# Patient Record
Sex: Female | Born: 1955 | Hispanic: No | Marital: Married | Smoking: Never smoker
Health system: Southern US, Community
[De-identification: ages and names within clinical notes are randomized; demographics above are authoritative.]

## PROBLEM LIST (undated history)

## (undated) ENCOUNTER — Emergency Department (HOSPITAL_BASED_OUTPATIENT_CLINIC_OR_DEPARTMENT_OTHER): Admission: EM | Payer: Self-pay

## (undated) DIAGNOSIS — I10 Essential (primary) hypertension: Secondary | ICD-10-CM

## (undated) DIAGNOSIS — K219 Gastro-esophageal reflux disease without esophagitis: Secondary | ICD-10-CM

## (undated) DIAGNOSIS — E785 Hyperlipidemia, unspecified: Secondary | ICD-10-CM

## (undated) DIAGNOSIS — E7800 Pure hypercholesterolemia, unspecified: Secondary | ICD-10-CM

## (undated) HISTORY — PX: ABDOMINAL HYSTERECTOMY: SHX81

## (undated) HISTORY — PX: APPENDECTOMY: SHX54

---

## 2000-11-18 ENCOUNTER — Encounter: Admission: RE | Admit: 2000-11-18 | Discharge: 2000-11-18 | Payer: Self-pay | Admitting: *Deleted

## 2000-11-18 ENCOUNTER — Encounter: Payer: Self-pay | Admitting: *Deleted

## 2016-03-12 ENCOUNTER — Emergency Department (HOSPITAL_BASED_OUTPATIENT_CLINIC_OR_DEPARTMENT_OTHER): Payer: Worker's Compensation

## 2016-03-12 ENCOUNTER — Emergency Department (HOSPITAL_BASED_OUTPATIENT_CLINIC_OR_DEPARTMENT_OTHER)
Admission: EM | Admit: 2016-03-12 | Discharge: 2016-03-12 | Disposition: A | Discharge status: Home / Self Care | Payer: Worker's Compensation | Attending: Emergency Medicine | Admitting: Emergency Medicine

## 2016-03-12 ENCOUNTER — Encounter (HOSPITAL_BASED_OUTPATIENT_CLINIC_OR_DEPARTMENT_OTHER): Payer: Self-pay | Admitting: *Deleted

## 2016-03-12 DIAGNOSIS — Y9301 Activity, walking, marching and hiking: Secondary | ICD-10-CM | POA: Insufficient documentation

## 2016-03-12 DIAGNOSIS — S8991XA Unspecified injury of right lower leg, initial encounter: Secondary | ICD-10-CM | POA: Diagnosis present

## 2016-03-12 DIAGNOSIS — Y999 Unspecified external cause status: Secondary | ICD-10-CM | POA: Diagnosis not present

## 2016-03-12 DIAGNOSIS — S80211A Abrasion, right knee, initial encounter: Secondary | ICD-10-CM | POA: Insufficient documentation

## 2016-03-12 DIAGNOSIS — Z79899 Other long term (current) drug therapy: Secondary | ICD-10-CM | POA: Diagnosis not present

## 2016-03-12 DIAGNOSIS — M25511 Pain in right shoulder: Secondary | ICD-10-CM | POA: Insufficient documentation

## 2016-03-12 DIAGNOSIS — W010XXA Fall on same level from slipping, tripping and stumbling without subsequent striking against object, initial encounter: Secondary | ICD-10-CM | POA: Diagnosis not present

## 2016-03-12 DIAGNOSIS — W19XXXA Unspecified fall, initial encounter: Secondary | ICD-10-CM

## 2016-03-12 DIAGNOSIS — I10 Essential (primary) hypertension: Secondary | ICD-10-CM | POA: Insufficient documentation

## 2016-03-12 DIAGNOSIS — M25561 Pain in right knee: Secondary | ICD-10-CM

## 2016-03-12 DIAGNOSIS — Y929 Unspecified place or not applicable: Secondary | ICD-10-CM | POA: Insufficient documentation

## 2016-03-12 HISTORY — DX: Gastro-esophageal reflux disease without esophagitis: K21.9

## 2016-03-12 HISTORY — DX: Essential (primary) hypertension: I10

## 2016-03-12 HISTORY — DX: Hyperlipidemia, unspecified: E78.5

## 2016-03-12 NOTE — ED Triage Notes (Signed)
pt c/o fall at work with right knee and right shoulder pain

## 2016-03-12 NOTE — Discharge Instructions (Signed)
Your xrays are normal today.  Please wear the immobilizer while up and about.  Take 800 mg ibuprofen and 1000 mg Tylenol for pain.  Ice your knee 10-20 min three times daily.  Follow up with your primary care physician.  Return to the ED for any new or concerning symptoms.

## 2016-03-12 NOTE — ED Notes (Signed)
[facility] drug and etoh screen done

## 2016-03-12 NOTE — ED Notes (Signed)
ED Provider at bedside. 

## 2016-03-12 NOTE — ED Provider Notes (Signed)
[facility]   CSN: [id number] Arrival date & time: 03/12/16  1136     History   Chief Complaint Chief Complaint  Patient presents with  . Knee Injury    HPI Rachael Lawrence is a 61 y.o. female.  HPI Rachael Lawrence is a 61 y.o. female with PMH significant for GERD, HLD, and HTN who presents with right knee pain and right shoulder pain s/p fall.  Patient was walking to work when she slipped on ice, landing on her right knee.  No head injury or LOC.  Denies pain elsewhere.  Associated symptoms include abrasion to right knee and swelling.  She is ambulatory.  Denies numbness or weakness. Tetanus is up to date.   Past Medical History:  Diagnosis Date  . GERD (gastroesophageal reflux disease)   . Hyperlipemia   . Hypertension     There are no active problems to display for this patient.   Past Surgical History:  Procedure Laterality Date  . ABDOMINAL HYSTERECTOMY    . APPENDECTOMY      OB History    No data available       Home Medications    Prior to Admission medications   Medication Sig Start Date End Date Taking? Authorizing Provider  nadolol (CORGARD) 80 MG tablet Take 80 mg by mouth daily.   Yes Historical Provider, MD  pantoprazole (PROTONIX) 40 MG tablet Take 40 mg by mouth daily.   Yes Historical Provider, MD  simvastatin (ZOCOR) 20 MG tablet Take 20 mg by mouth daily.   Yes Historical Provider, MD    Family History History reviewed. No pertinent family history.  Social History Social History  Substance Use Topics  . Smoking status: Never Smoker  . Smokeless tobacco: Never Used  . Alcohol use No     Allergies   Patient has no known allergies.   Review of Systems Review of Systems All other systems negative unless otherwise stated in HPI   Physical Exam Updated Vital Signs BP 138/80 (BP Location: Right Arm)   Pulse 67   Temp 98.2 F (36.8 C)   Resp 18   Ht 5\' 6"  (1.676 m)   Wt 96.6 kg   SpO2 99%   BMI 34.38 kg/m     Physical Exam  Constitutional: She is oriented to person, place, and time. She appears well-developed and well-nourished.  HENT:  Head: Normocephalic and atraumatic.  Right Ear: External ear normal.  Left Ear: External ear normal.  Eyes: Conjunctivae are normal. No scleral icterus.  Neck: No tracheal deviation present.  Cardiovascular:  Pulses:      Dorsalis pedis pulses are 2+ on the right side, and 2+ on the left side.  Pulmonary/Chest: Effort normal. No respiratory distress.  Abdominal: She exhibits no distension.  Musculoskeletal: Normal range of motion. She exhibits tenderness.  Right knee with small abrasion and swelling. Normal ROM.  Neurological: She is alert and oriented to person, place, and time.  Normal strength and sensation throughout lower extremities.   Skin: Skin is warm and dry.  Psychiatric: She has a normal mood and affect. Her behavior is normal.     ED Treatments / Results  Labs (all labs ordered are listed, but only abnormal results are displayed) Labs Reviewed - No data to display  EKG  EKG Interpretation None       Radiology Dg Shoulder Right  Result Date: 03/12/2016 CLINICAL DATA:  Fall. EXAM: RIGHT SHOULDER - 2+ VIEW COMPARISON:  No recent.  FINDINGS: Metallic densities noted over the right shoulder soft tissues most likely within the patient's hair. Acromioclavicular and glenohumeral degenerative change. No evidence of fracture or dislocation. No acute abnormality. IMPRESSION: Acromioclavicular and glenohumeral degenerative change. No acute bony abnormality identified. Electronically Signed   By: Maisie Fushomas  Register   On: 03/12/2016 12:10   Dg Knee Complete 4 Views Right  Result Date: 03/12/2016 CLINICAL DATA:  Shoulder pain.  Fall. EXAM: RIGHT KNEE - COMPLETE 4+ VIEW COMPARISON:  No recent prior . FINDINGS: No acute bony or joint abnormality identified. No evidence of fracture or dislocation. Medial and patellofemoral compartment degenerative  change. Small knee joint effusion cannot be excluded IMPRESSION: 1. Small knee joint effusion.  No acute bony abnormality identified. 2.  Medial and patellofemoral compartment degenerative change . Electronically Signed   By: Maisie Fushomas  Register   On: 03/12/2016 12:12    Procedures Procedures (including critical care time)  Medications Ordered in ED Medications - No data to display   Initial Impression / Assessment and Plan / ED Course  I have reviewed the triage vital signs and the nursing notes.  Pertinent labs & imaging results that were available during my care of the patient were reviewed by me and considered in my medical decision making (see chart for details).  Clinical Course    Patient here s/p mechanical fall now with right knee pain. Patient X-Ray negative for obvious fracture or dislocation.  Pt advised to follow up with orthopedics. Patient given knee immobilizer while in ED, conservative therapy recommended and discussed. Patient will be discharged home & is agreeable with above plan. Returns precautions discussed. Pt appears safe for discharge.   Final Clinical Impressions(s) / ED Diagnoses   Final diagnoses:  Acute pain of right knee  Fall, initial encounter    New Prescriptions New Prescriptions   No medications on file     Cheri FowlerKayla Ishmel Acevedo, PA-C 03/12/16 1358    Nira ConnPedro Eduardo Cardama, MD 03/13/16 1142

## 2016-04-08 ENCOUNTER — Emergency Department (HOSPITAL_BASED_OUTPATIENT_CLINIC_OR_DEPARTMENT_OTHER)
Admission: EM | Admit: 2016-04-08 | Discharge: 2016-04-09 | Disposition: A | Discharge status: Home / Self Care | Payer: 59 | Attending: Emergency Medicine | Admitting: Emergency Medicine

## 2016-04-08 ENCOUNTER — Encounter (HOSPITAL_BASED_OUTPATIENT_CLINIC_OR_DEPARTMENT_OTHER): Payer: Self-pay

## 2016-04-08 ENCOUNTER — Emergency Department (HOSPITAL_BASED_OUTPATIENT_CLINIC_OR_DEPARTMENT_OTHER): Payer: 59

## 2016-04-08 DIAGNOSIS — Z79899 Other long term (current) drug therapy: Secondary | ICD-10-CM | POA: Insufficient documentation

## 2016-04-08 DIAGNOSIS — R0789 Other chest pain: Secondary | ICD-10-CM | POA: Insufficient documentation

## 2016-04-08 DIAGNOSIS — I10 Essential (primary) hypertension: Secondary | ICD-10-CM | POA: Diagnosis not present

## 2016-04-08 HISTORY — DX: Pure hypercholesterolemia, unspecified: E78.00

## 2016-04-08 LAB — BASIC METABOLIC PANEL
ANION GAP: 9 (ref 5–15)
BUN: 16 mg/dL (ref 6–20)
CALCIUM: 9 mg/dL (ref 8.9–10.3)
CO2: 27 mmol/L (ref 22–32)
CREATININE: 0.62 mg/dL (ref 0.44–1.00)
Chloride: 103 mmol/L (ref 101–111)
GFR calc Af Amer: >60 mL/min (ref >60)
GLUCOSE: 94 mg/dL (ref 65–99)
Potassium: 3.2 mmol/L — ABNORMAL LOW (ref 3.5–5.1)
Sodium: 139 mmol/L (ref 135–145)

## 2016-04-08 LAB — TROPONIN I

## 2016-04-08 LAB — CBC
HCT: 38.9 % (ref 36.0–46.0)
Hemoglobin: 12.5 g/dL (ref 12.0–15.0)
MCH: 27.4 pg (ref 26.0–34.0)
MCHC: 32.1 g/dL (ref 30.0–36.0)
MCV: 85.3 fL (ref 78.0–100.0)
PLATELETS: 313 10*3/uL (ref 150–400)
RBC: 4.56 MIL/uL (ref 3.87–5.11)
RDW: 14.7 % (ref 11.5–15.5)
WBC: 8 10*3/uL (ref 4.0–10.5)

## 2016-04-08 NOTE — ED Triage Notes (Signed)
C/o pain to left chest/breast x 3 days-NAD-steady gait

## 2016-04-08 NOTE — ED Notes (Signed)
Cp since Saturday w dizziness onset today denies n/v, no sob

## 2016-04-08 NOTE — ED Notes (Signed)
ED Provider at bedside. 

## 2016-04-08 NOTE — ED Provider Notes (Signed)
[facility] Provider Note: Rachael DellJ. Lane British Moyd, MD, FACEP  CSN: [id number] MRN: [id number] ARRIVAL: 04/08/16 at 1746 ROOM: [facility]  By signing my name below, I, Rachael Lawrence, attest that this documentation has been prepared under the direction and in the presence of Rachael LibraJohn Peggyann Zwiefelhofer, MD  Electronically Signed: Clovis PuAvnee Lawrence, ED Scribe. 04/08/16. 11:06 PM.   CHIEF COMPLAINT  Chest Pain   HISTORY OF PRESENT ILLNESS  HPI Comments:  Rachael Lawrence is a 61 y.o. female who presents to the Emergency Department complaining of constant left lateral chest pain well-localized under left breast onset 3 days ago. She describes her pain as a pressure and rates it as a 6/10. Nothing makes her pain better or worse. She is also having pain in the right side of her neck with associated numbness to her right upper extremity. This numbness is not changed with movement of her neck. She takes aspirin on a daily basis and noted this has provided some pain relief. Pt denies SOB, nausea and diaphoresis.   Past Medical History:  Diagnosis Date  . High cholesterol   . Hypertension     Past Surgical History:  Procedure Laterality Date  . ABDOMINAL HYSTERECTOMY    . APPENDECTOMY      No family history on file.  Social History  Substance Use Topics  . Smoking status: Never Smoker  . Smokeless tobacco: Never Used  . Alcohol use Yes     Comment: occ    Prior to Admission medications   Medication Sig Start Date End Date Taking? Authorizing Provider  HYDRALAZINE-HCTZ PO Take by mouth.   Yes Historical Provider, MD  NADOLOL PO Take by mouth.   Yes Historical Provider, MD  PANTOPRAZOLE SODIUM PO Take by mouth.   Yes Historical Provider, MD  SIMVASTATIN PO Take by mouth.   Yes Historical Provider, MD    Allergies Patient has no known allergies.   REVIEW OF SYSTEMS  Negative except as noted here or in the History of Present Illness.   PHYSICAL EXAMINATION  Initial Vital Signs Blood pressure  123/65, pulse 60, temperature 98.6 F (37 C), temperature source Oral, resp. rate 17, height 5\' 5"  (1.651 m), weight 213 lb (96.6 kg), SpO2 99 %.  Examination General: Well-developed, well-nourished female in no acute distress; appearance consistent with age of record HENT: normocephalic; atraumatic Eyes: pupils equal, round and reactive to light; extraocular muscles intact Neck: supple Heart: regular rate and rhythm; no murmurs, rubs or gallops Lungs: clear to auscultation bilaterally Chest: Non-tender and no rash at site of pain Abdomen: soft; nondistended; nontender; no masses or hepatosplenomegaly; bowel sounds present Extremities: No deformity; full range of motion; pulses normal; no edema Neurologic: Awake, alert and oriented; motor function intact in all extremities and symmetric; no facial droop Skin: Warm and dry Psychiatric: Normal mood and affect   RESULTS  Summary of this visit's results, reviewed by myself:   EKG Interpretation  Date/Time:  Monday April 08 2016 18:01:40 EST Ventricular Rate:  69 PR Interval:  138 QRS Duration: 82 QT Interval:  406 QTC Calculation: 435 R Axis:   57 Text Interpretation:  Normal sinus rhythm Normal ECG No previous ECGs available Confirmed by Rachael Harnack  MD, Rachael Lawrence ([id number]) on 04/08/2016 10:42:09 PM      Laboratory Studies: Results for orders placed or performed during the hospital encounter of 04/08/16 (from the past 24 hour(s))  Basic metabolic panel     Status: Abnormal   Collection Time: 04/08/16  8:20 PM  Result Value Ref Range   Sodium 139 135 - 145 mmol/L   Potassium 3.2 (L) 3.5 - 5.1 mmol/L   Chloride 103 101 - 111 mmol/L   CO2 27 22 - 32 mmol/L   Glucose, Bld 94 65 - 99 mg/dL   BUN 16 6 - 20 mg/dL   Creatinine, Ser 1.61 0.44 - 1.00 mg/dL   Calcium 9.0 8.9 - 09.6 mg/dL   GFR calc non Af Amer >60 >60 mL/min   GFR calc Af Amer >60 >60 mL/min   Anion gap 9 5 - 15  CBC     Status: None   Collection Time: 04/08/16  8:20 PM    Result Value Ref Range   WBC 8.0 4.0 - 10.5 K/uL   RBC 4.56 3.87 - 5.11 MIL/uL   Hemoglobin 12.5 12.0 - 15.0 g/dL   HCT 04.5 40.9 - 81.1 %   MCV 85.3 78.0 - 100.0 fL   MCH 27.4 26.0 - 34.0 pg   MCHC 32.1 30.0 - 36.0 g/dL   RDW 91.4 78.2 - 95.6 %   Platelets 313 150 - 400 K/uL  Troponin I     Status: None   Collection Time: 04/08/16  8:20 PM  Result Value Ref Range   Troponin I <0.03 <0.03 ng/mL  Troponin I     Status: None   Collection Time: 04/08/16 11:17 PM  Result Value Ref Range   Troponin I <0.03 <0.03 ng/mL   Imaging Studies: Dg Chest 2 View  Result Date: 04/08/2016 CLINICAL DATA:  Three-day history of left chest pain and right upper extremity pain. Current history of hypertension and hypercholesterolemia. EXAM: CHEST  2 VIEW COMPARISON:  None. FINDINGS: Cardiomediastinal silhouette unremarkable. Lungs clear. Bronchovascular markings normal. Pulmonary vascularity normal. No pneumothorax. No pleural effusions. Visualized bony thorax intact. IMPRESSION: Normal examination. Electronically Signed   By: Hulan Saas M.D.   On: 04/08/2016 19:24    ED COURSE  Nursing notes and initial vitals signs, including pulse oximetry, reviewed.  Vitals:   04/08/16 2200 04/08/16 2230 04/08/16 2300 04/08/16 2330  BP: 143/83 136/62 149/97 131/57  Pulse: 67 76 66 67  Resp: 19 22 16 19   Temp:      TempSrc:      SpO2: 99% 100% 100% 100%  Weight:      Height:       12:01 AM Patient's pain is atypical for cardiac chest pain. There is no exertional component nor is there associated dyspnea, nausea or diaphoresis. She will follow-up with her primary care physician tomorrow.  PROCEDURES    ED DIAGNOSES     ICD-9-CM ICD-10-CM   1. Atypical chest pain 786.59 R07.89     I personally performed the services described in this documentation, which was scribed in my presence. The recorded information has been reviewed and is accurate.     Rachael Libra, MD 04/09/16 Rachael Lawrence

## 2016-04-09 ENCOUNTER — Encounter (HOSPITAL_BASED_OUTPATIENT_CLINIC_OR_DEPARTMENT_OTHER): Payer: Self-pay

## 2017-12-23 IMAGING — DX DG SHOULDER 2+V*R*
4 series · 4 of 4 positions shown · non-contrast
Comparison: No recent.

CLINICAL DATA: Fall.

EXAM:
RIGHT SHOULDER - 2+ VIEW

[shoulder grashey]
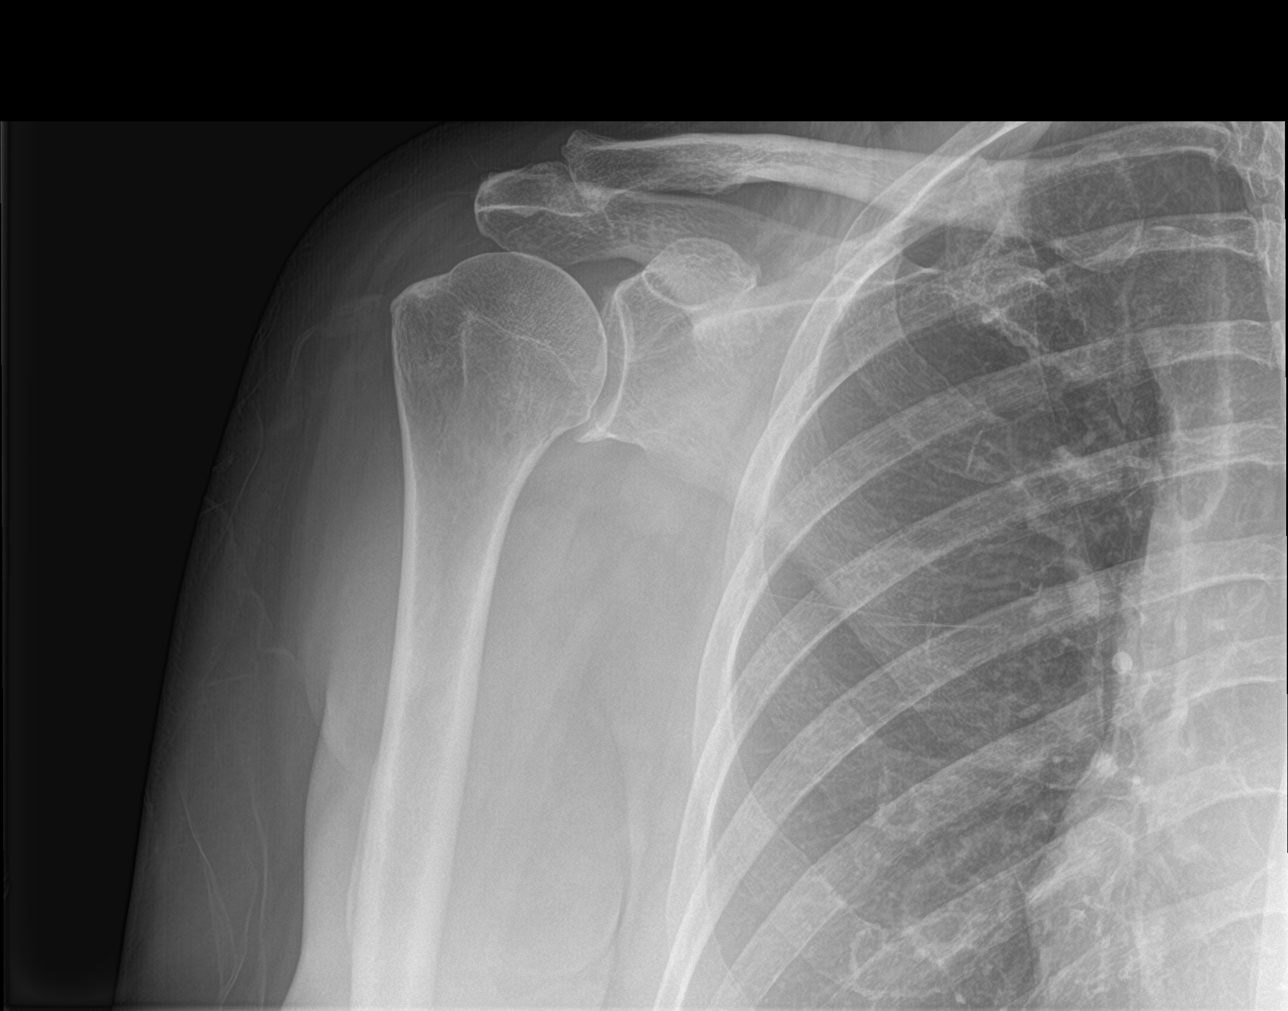

[shoulder y view (1 of 2)]
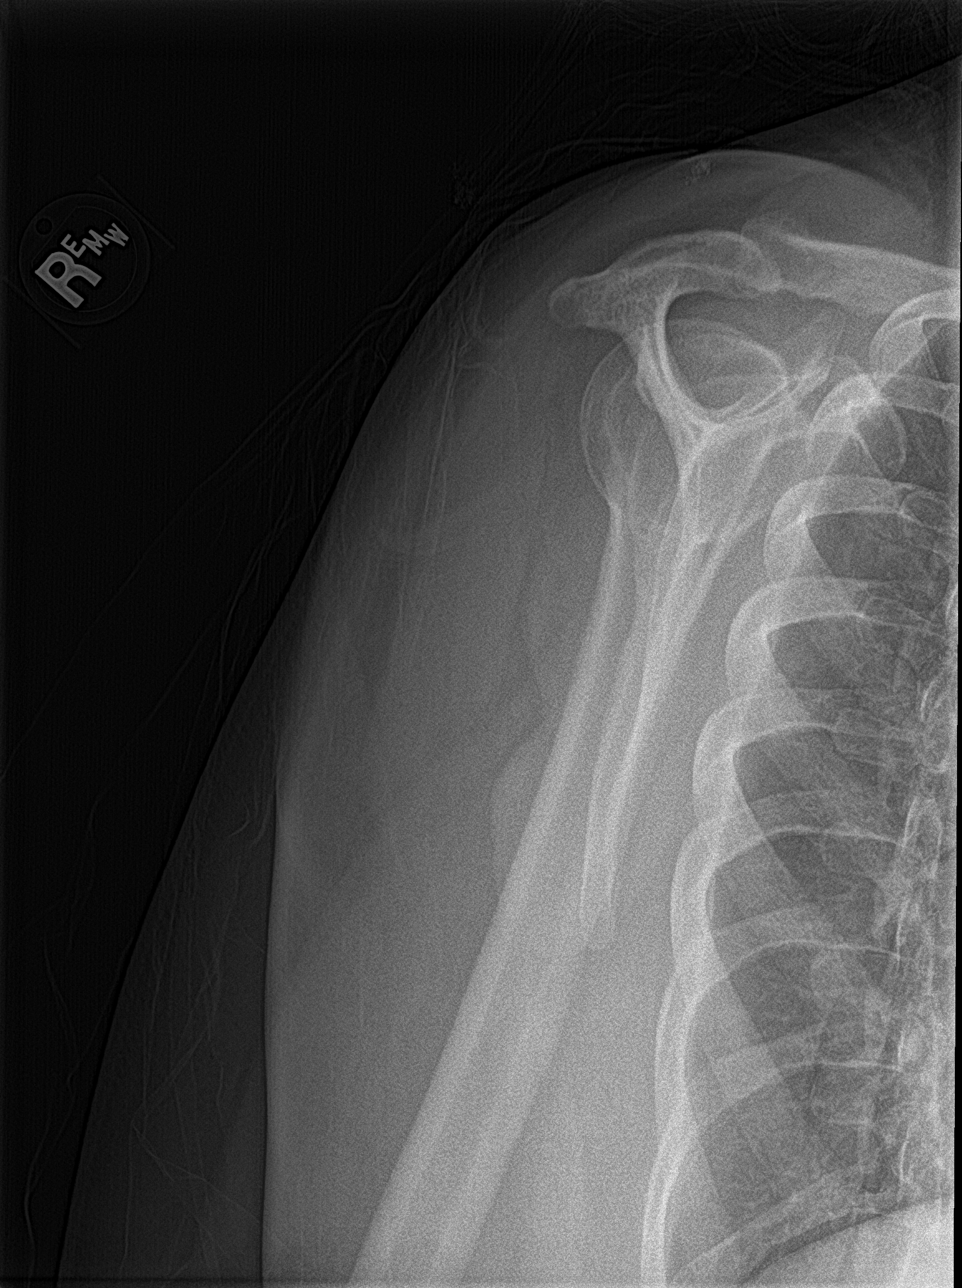

[shoulder ap neutral]
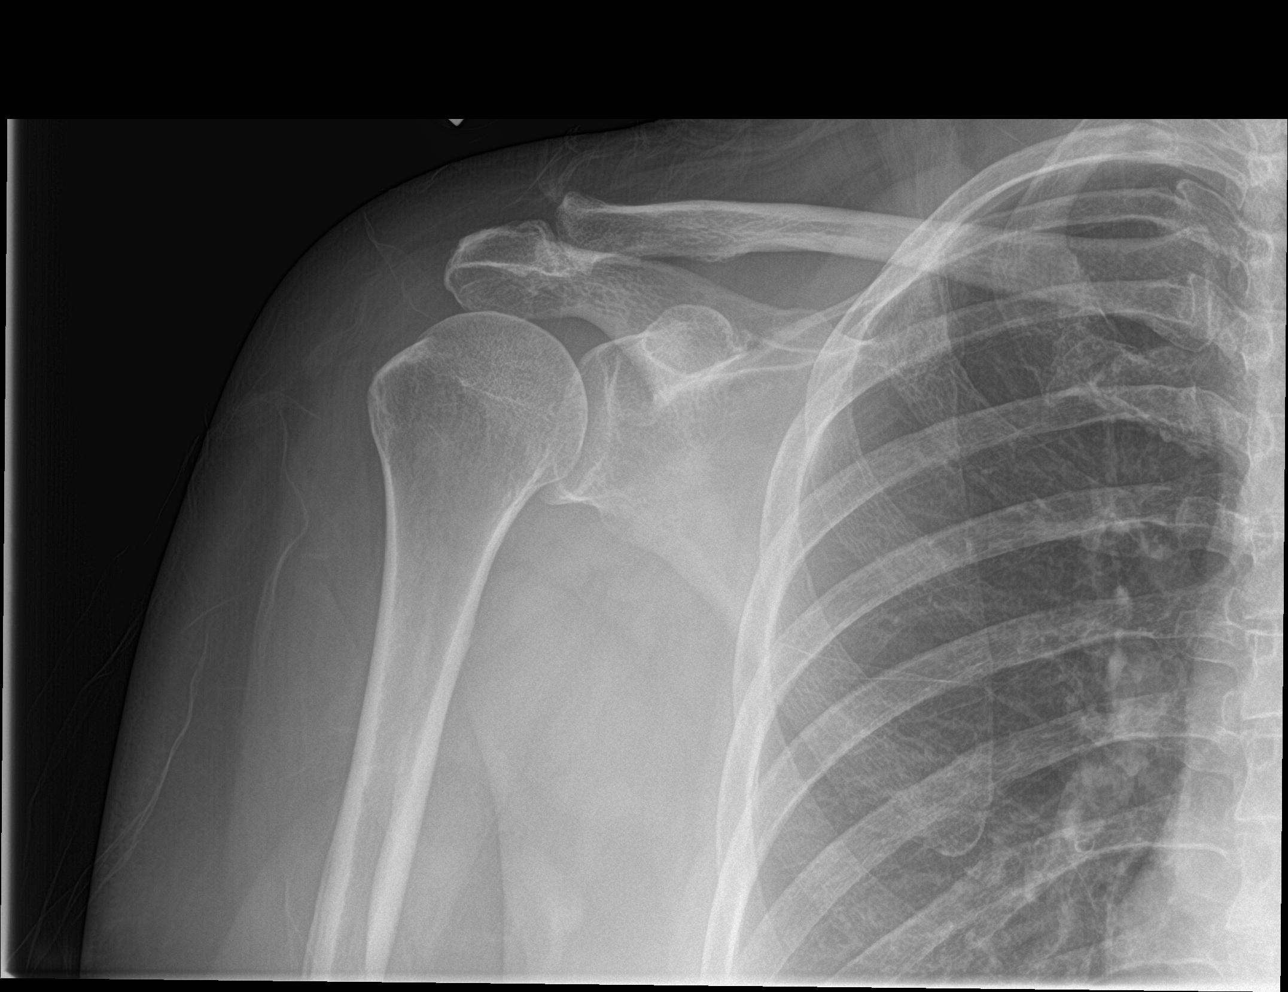

[shoulder y view (2 of 2)]
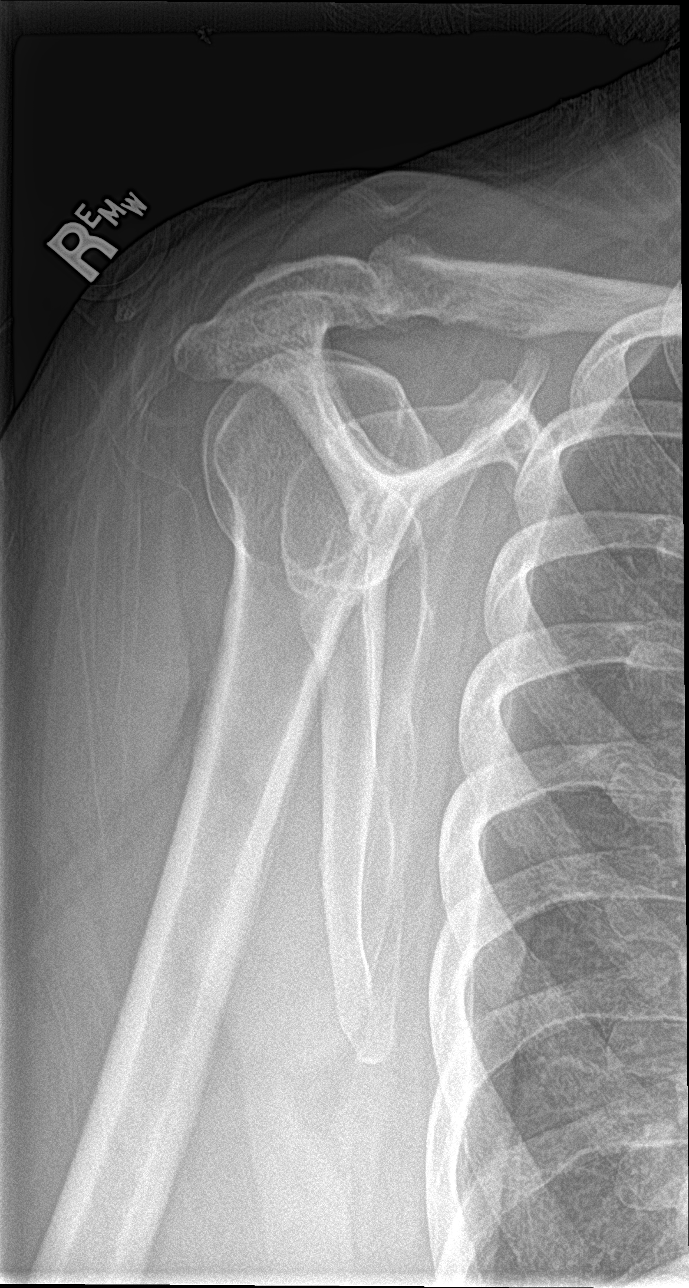

[4 of 4 positions shown; findings below may reference images not displayed]

FINDINGS: Metallic densities noted over the right shoulder soft tissues most
likely within the patient's hair. Acromioclavicular and glenohumeral
degenerative change. No evidence of fracture or dislocation. No
acute abnormality.
IMPRESSION: Acromioclavicular and glenohumeral degenerative change. No acute
bony abnormality identified.

## 2018-01-19 IMAGING — CR DG CHEST 2V
2 series · 2 of 2 positions shown · non-contrast
Comparison: None.

CLINICAL DATA: Three-day history of left chest pain and right upper
extremity pain. Current history of hypertension and
hypercholesterolemia.

EXAM:
CHEST  2 VIEW

[w chest pa]
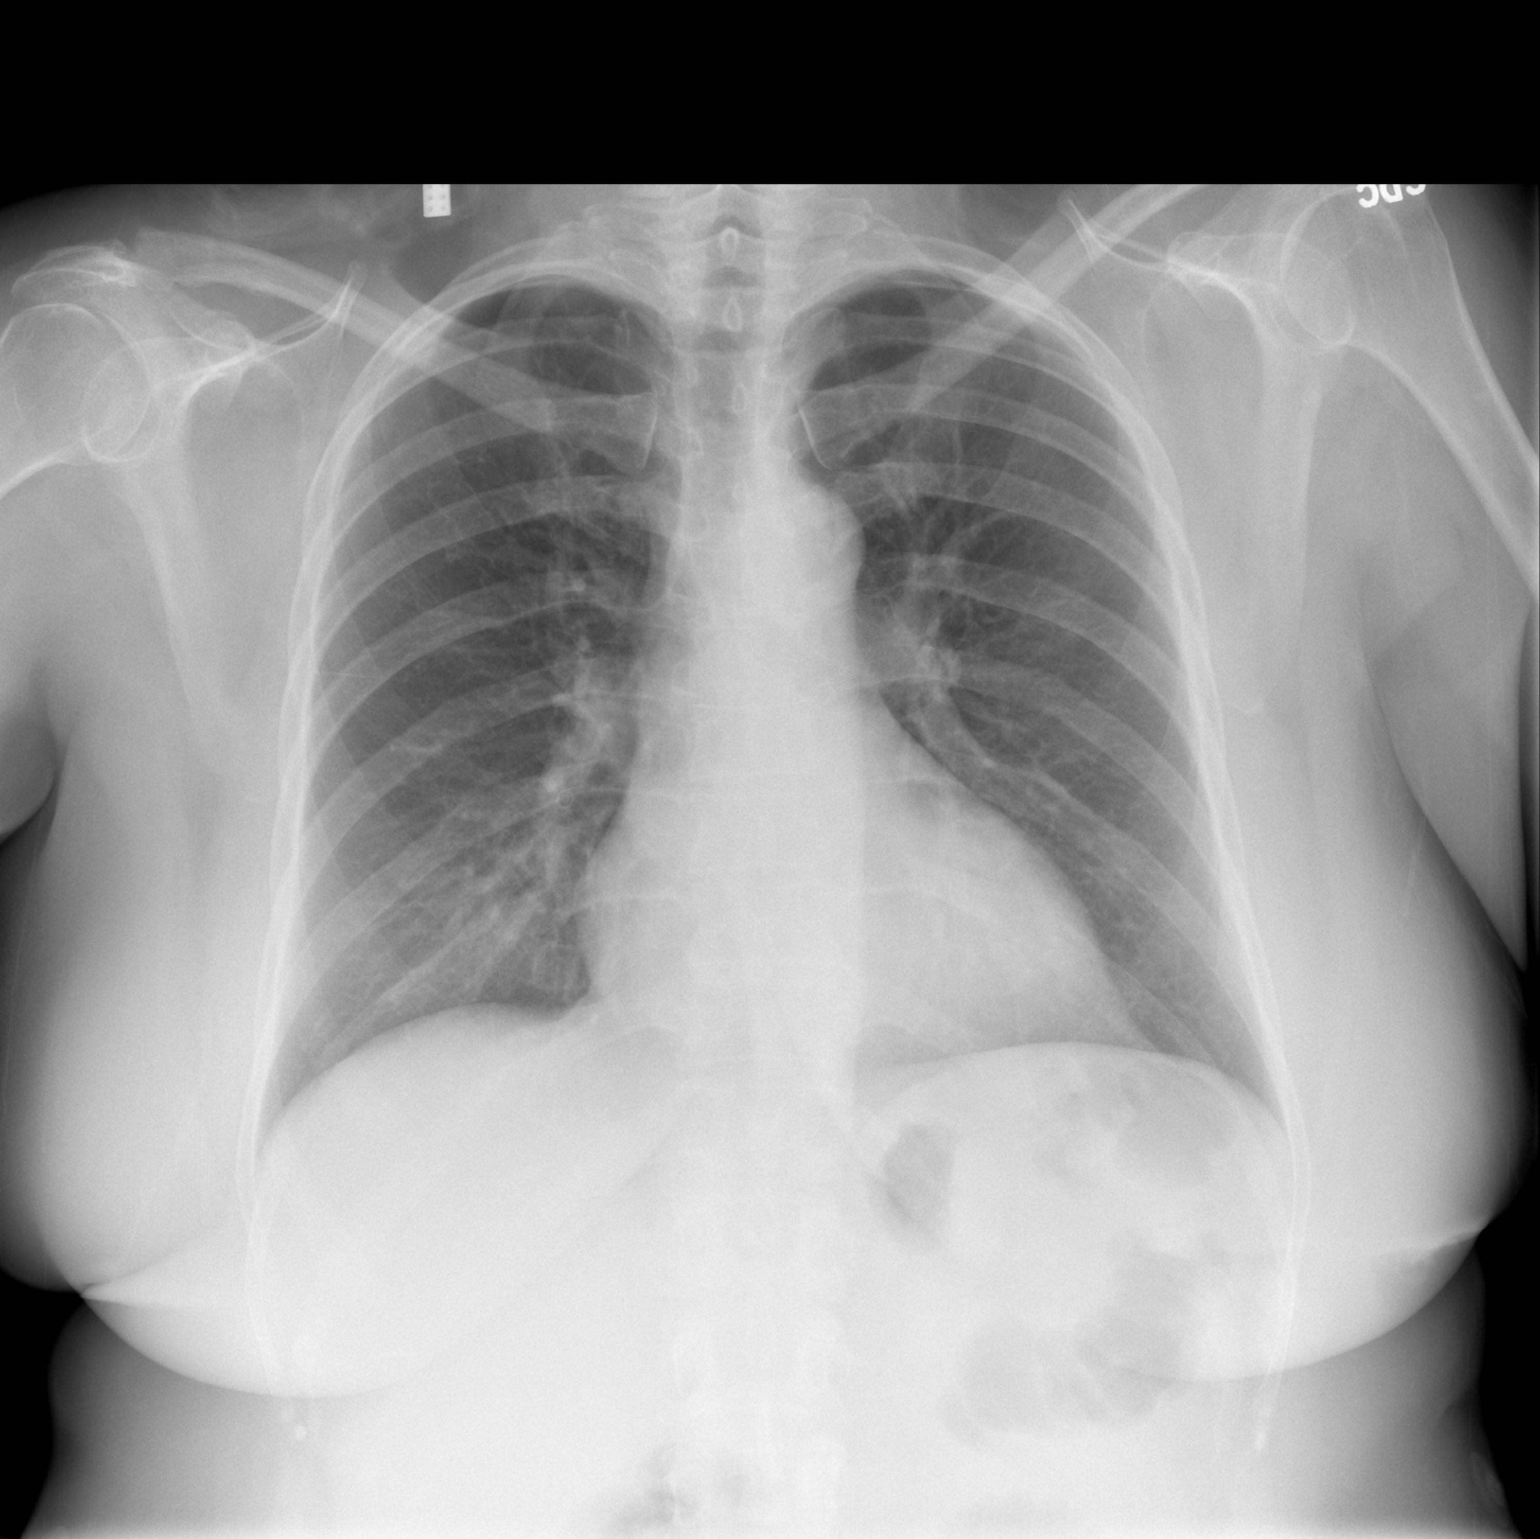

[w chest lat]
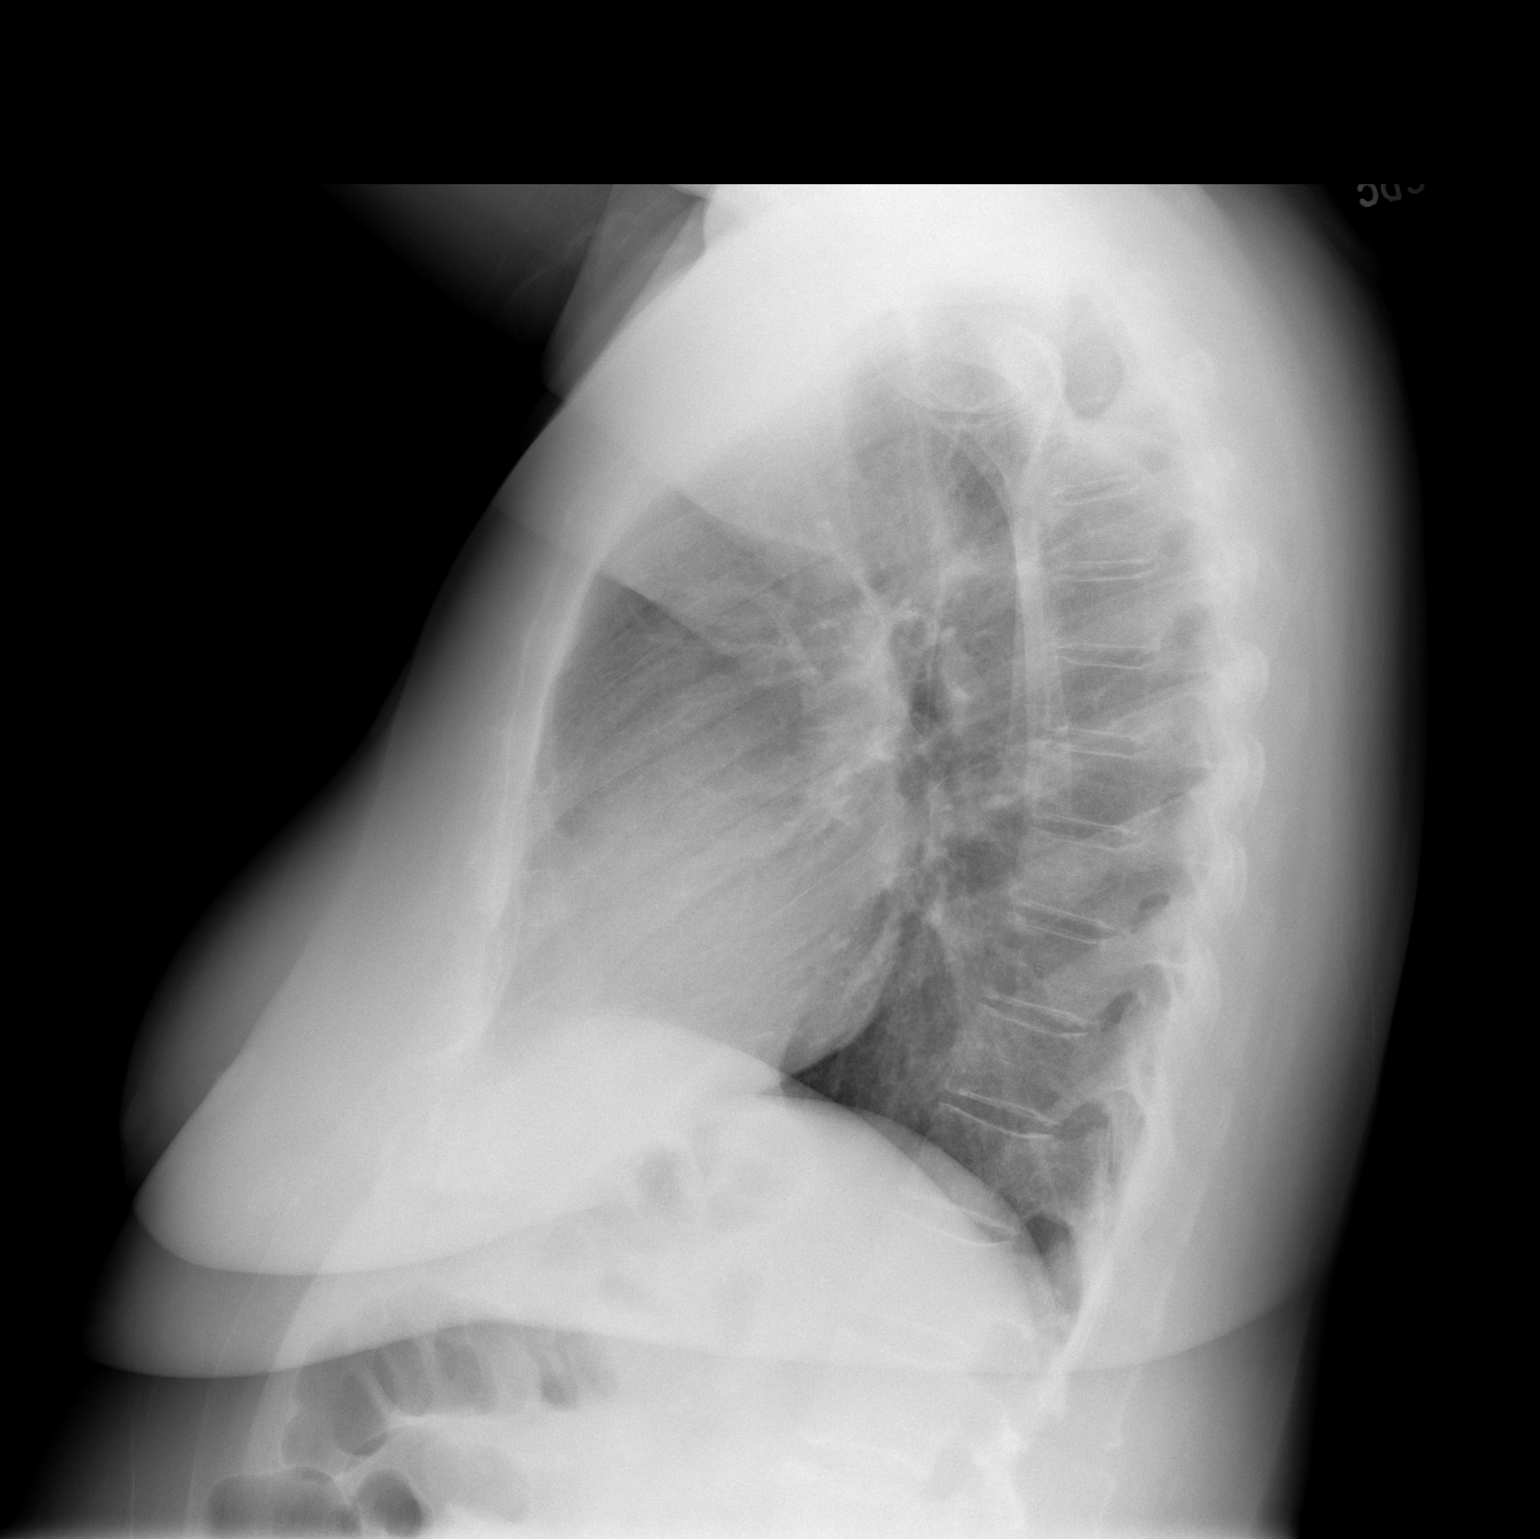

[2 of 2 positions shown; findings below may reference images not displayed]

FINDINGS: Cardiomediastinal silhouette unremarkable. Lungs clear.
Bronchovascular markings normal. Pulmonary vascularity normal. No
pneumothorax. No pleural effusions. Visualized bony thorax intact.
IMPRESSION: Normal examination.

## 2024-03-29 ENCOUNTER — Ambulatory Visit: Admitting: Family Medicine

## 2024-04-26 ENCOUNTER — Ambulatory Visit: Admitting: Family Medicine
